# Patient Record
Sex: Female | Born: 1985 | Hispanic: Yes | Marital: Single
Health system: Southern US, Community
[De-identification: ages and names within clinical notes are randomized; demographics above are authoritative.]

---

## 2004-06-14 ENCOUNTER — Emergency Department: Payer: Self-pay | Admitting: Emergency Medicine

## 2011-03-02 ENCOUNTER — Ambulatory Visit: Payer: Self-pay | Admitting: Family Medicine

## 2011-07-27 ENCOUNTER — Inpatient Hospital Stay: Payer: Self-pay | Admitting: Obstetrics and Gynecology

## 2011-07-27 LAB — CBC WITH DIFFERENTIAL/PLATELET
Basophil #: 0.2 10*3/uL — ABNORMAL HIGH (ref 0.0–0.1)
Eosinophil #: 1 10*3/uL — ABNORMAL HIGH (ref 0.0–0.7)
HCT: 31.7 % — ABNORMAL LOW (ref 35.0–47.0)
HGB: 10.3 g/dL — ABNORMAL LOW (ref 12.0–16.0)
Lymphocyte %: 17.3 %
MCHC: 32.5 g/dL (ref 32.0–36.0)
MCV: 79 fL — ABNORMAL LOW (ref 80–100)
Monocyte %: 7.5 %
Neutrophil #: 6.2 10*3/uL (ref 1.4–6.5)
Platelet: 172 10*3/uL (ref 150–440)
RDW: 15.2 % — ABNORMAL HIGH (ref 11.5–14.5)

## 2011-07-28 LAB — HEMATOCRIT: HCT: 30.9 % — ABNORMAL LOW (ref 35.0–47.0)

## 2013-08-12 ENCOUNTER — Ambulatory Visit: Payer: Self-pay | Admitting: Primary Care

## 2014-01-03 ENCOUNTER — Inpatient Hospital Stay: Payer: Self-pay

## 2014-01-03 LAB — CBC WITH DIFFERENTIAL/PLATELET
Basophil #: 0.1 10*3/uL (ref 0.0–0.1)
Basophil %: 0.3 %
Eosinophil #: 0.2 10*3/uL (ref 0.0–0.7)
Eosinophil %: 1.1 %
HCT: 30.2 % — AB (ref 35.0–47.0)
HGB: 9.4 g/dL — ABNORMAL LOW (ref 12.0–16.0)
LYMPHS PCT: 5 %
Lymphocyte #: 1.1 10*3/uL (ref 1.0–3.6)
MCH: 24.8 pg — AB (ref 26.0–34.0)
MCHC: 30.9 g/dL — ABNORMAL LOW (ref 32.0–36.0)
MCV: 80 fL (ref 80–100)
Monocyte #: 1.1 x10 3/mm — ABNORMAL HIGH (ref 0.2–0.9)
Monocyte %: 5 %
Neutrophil #: 18.8 10*3/uL — ABNORMAL HIGH (ref 1.4–6.5)
Neutrophil %: 88.6 %
Platelet: 180 10*3/uL (ref 150–440)
RBC: 3.78 10*6/uL — ABNORMAL LOW (ref 3.80–5.20)
RDW: 15.6 % — AB (ref 11.5–14.5)
WBC: 21.3 10*3/uL — ABNORMAL HIGH (ref 3.6–11.0)

## 2014-01-04 LAB — HEMATOCRIT: HCT: 29.8 % — ABNORMAL LOW (ref 35.0–47.0)

## 2014-06-24 NOTE — H&P (Signed)
L&D Evaluation:  History:   HPI 29 y/o G3P2002 @ 39+wks EDC 07/29/11 arrives with c/o regular uc's, sm bloody show. Denies leaking fluid, baby is active.[facility] GBS +    Presents with contractions    Patient's Medical History No Chronic Illness    Patient's Surgical History none    Medications Pre Natal Vitamins    Allergies NKDA    Social History none    Family History Non-Contributory   ROS:   ROS All systems were reviewed.  HEENT, CNS, GI, GU, Respiratory, CV, Renal and Musculoskeletal systems were found to be normal.   Exam:   Vital Signs stable    Urine Protein not completed    General no apparent distress    Mental Status clear    Chest clear    Heart normal sinus rhythm    Abdomen gravid, non-tender    Estimated Fetal Weight Average for gestational age    Fetal Position vtx    Fundal Height term    Back no CVAT    Edema 2+  pedal    Reflexes 2+    Clonus negative    Pelvic no external lesions, 4cm 100% vtx @ -2 BOWI sm show    Mebranes Intact    FHT normal rate with no decels, baseline 130's 140's avg variability with accels    FHT Description 136    Ucx regular, q2/3 mins 45/60 sec moderate    Skin dry    Lymph no lymphadenopathy   Impression:   Impression early labor   Plan:   Plan monitor contractions and for cervical change, antibiotics for GBBS prophylaxis    Comments Admitted, knows what to expect 3rd baby. Breathing well thru uc's, declines pain meds, plans natural childbirth. IV ABX begun. Family supportive at bedside.   Electronic Signatures: Albertina ParrLugiano, [place] Katich B (CNM)  (Signed 12-Jun-13 01:26)  Authored: L&D Evaluation   Last Updated: 12-Jun-13 01:26 by Albertina ParrLugiano, Charman Blasco B (CNM)

## 2014-06-24 NOTE — H&P (Signed)
L&D Evaluation:  History:  HPI [id number] [facility] significant for UTI hx, GBS +, sinusitis, eosinophilia, attending [facility] for [facility] care [facility]. Pt had formerly been set up for induction of labor for 11/03/17/13 for post-dates. Pt is 7/100/vtx on last exam. O pos, Antibody neg, Pap neg, Rubella immune, Varicella immune, RPR NR, Neg HBSag, Chlamydia neg, declined AFP, 1 h GCT 138, GBS POS.   Presents with contractions   Patient's Medical History No Chronic Illness   Patient's Surgical History pt cannot answer   Medications Pre Natal Vitamins   Allergies NKDA   Social History none   Family History Non-Contributory   ROS:  ROS All systems were reviewed.  HEENT, CNS, GI, GU, Respiratory, CV, Renal and Musculoskeletal systems were found to be normal.   Exam:  Vital Signs stable   General no apparent distress, other   Mental Status clear   Chest clear   Heart normal sinus rhythm, no murmur/gallop/rubs   Abdomen gravid, non-tender   Estimated Fetal Weight Average for gestational age   Back no CVAT   Edema 1+   Reflexes 3+   Clonus negative   Pelvic 7/100/vtx   Mebranes Intact   Ucx q 2 mins, lass 40 secs   Skin dry   Impression:  Impression active labor   Plan:  Plan monitor contractions and for cervical change   Comments Cont to monitor UC/FHT, Antibiotcs starting to infuse   Electronic Signatures: Sharee PimpleJones, Caron W (CNM)  (Signed 20-Nov-15 05:27)  Authored: L&D Evaluation   Last Updated: 20-Nov-15 05:27 by Sharee PimpleJones, Caron W (CNM)

## 2015-11-20 IMAGING — US US OB US >=[ID] SNGL FETUS
1 series · 13 of 28 positions shown · non-contrast
Comparison: none

CLINICAL DATA: anatomy

EXAM:
ULTRASOUND OB >=L7BLE SINGLE FETUS

[Series 1: us ob us >=(id) sngl fetus · 0.24mm/px · 13 of 97 slices shown]
[im 4/97]
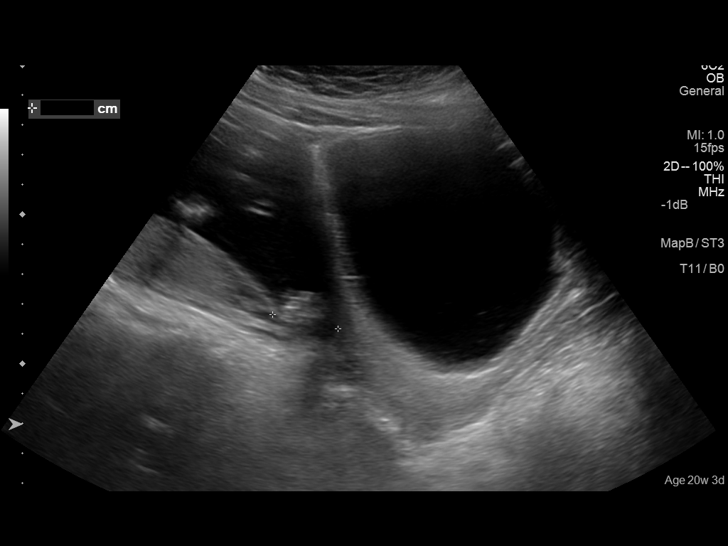
[im 11/97]
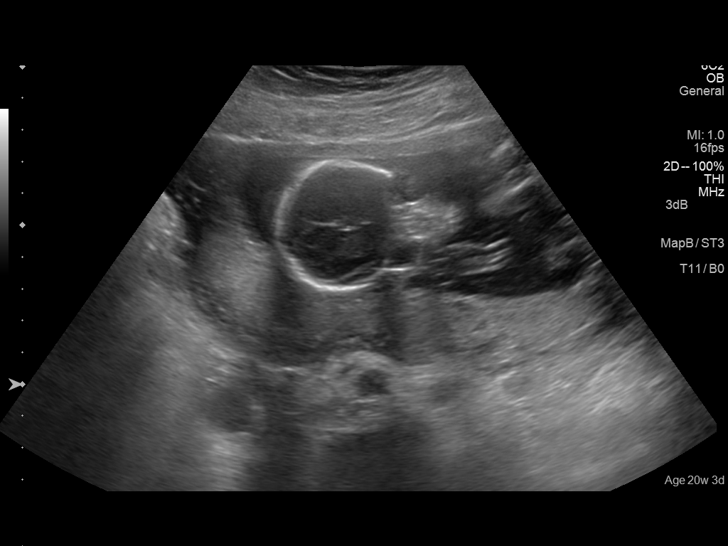
[im 18/97]
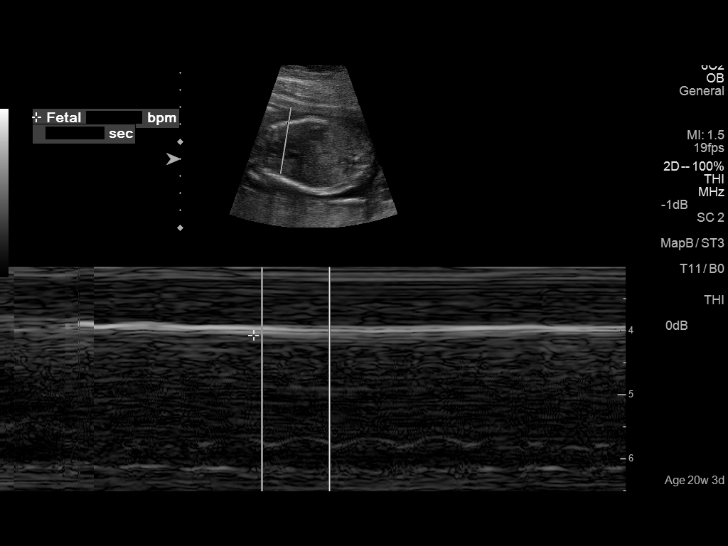
[im 25/97]
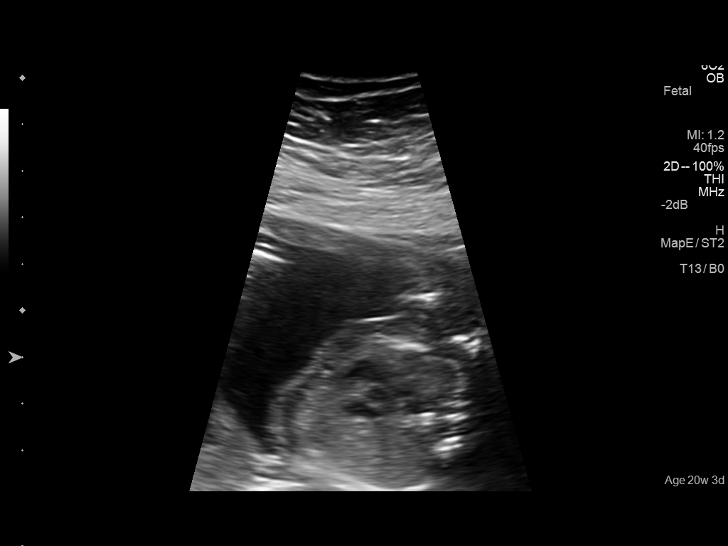
[im 33/97]
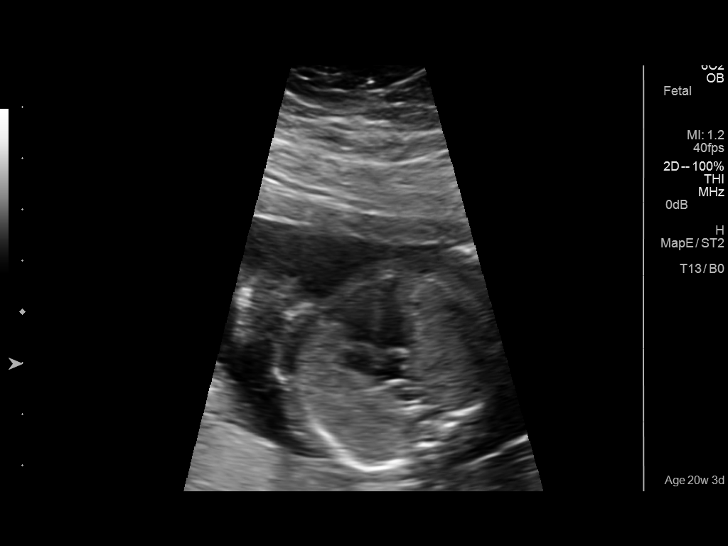
[im 40/97]
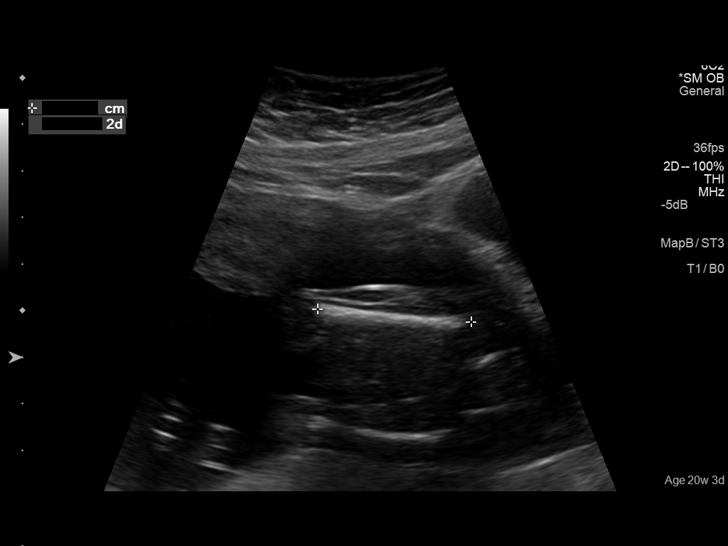
[im 50/97]
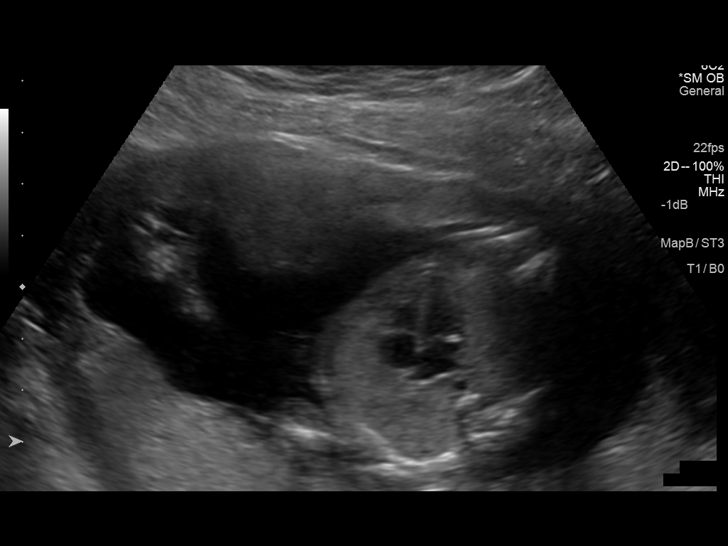
[im 57/97]
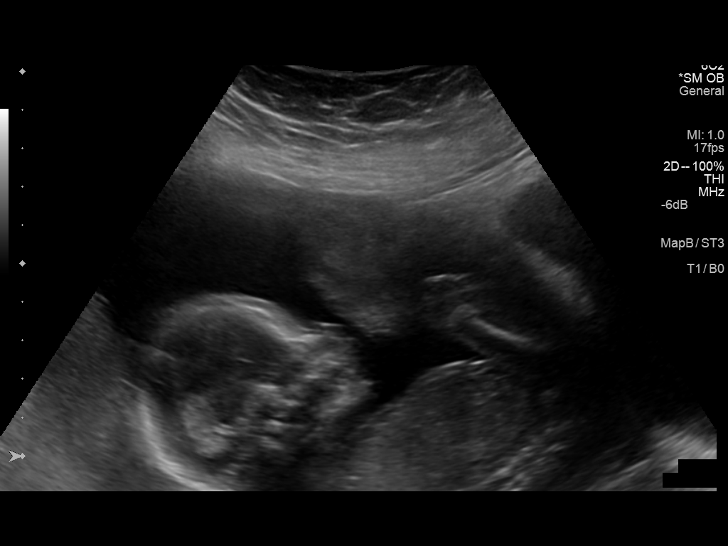
[im 65/97]
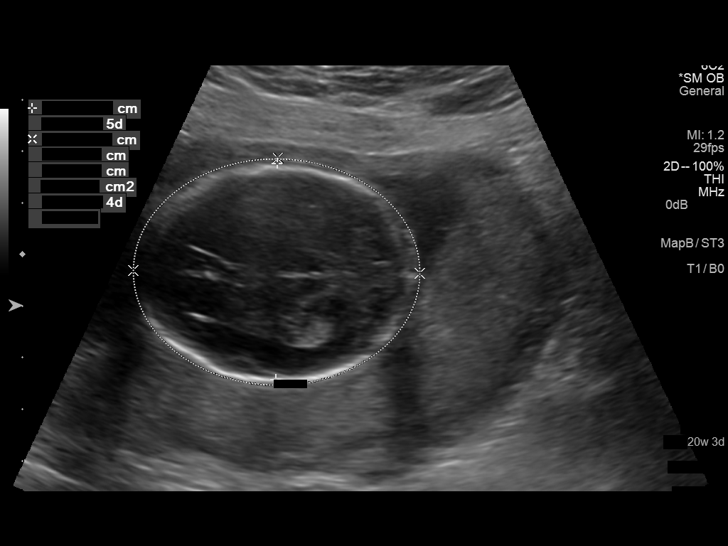
[im 72/97]
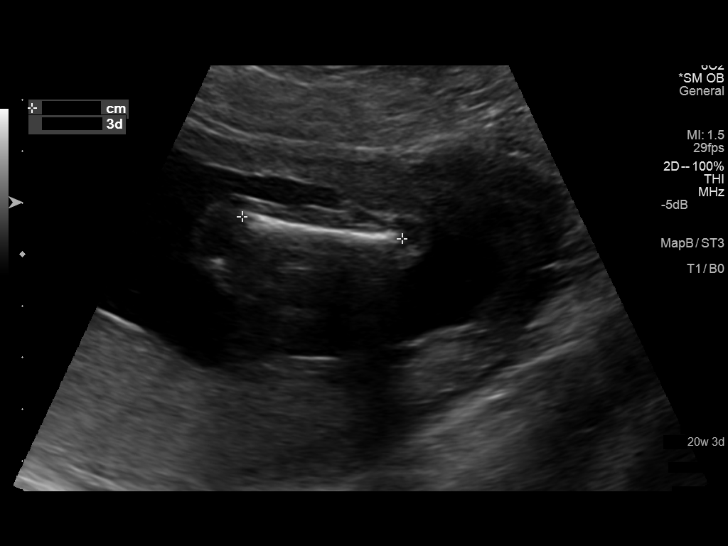
[im 79/97]
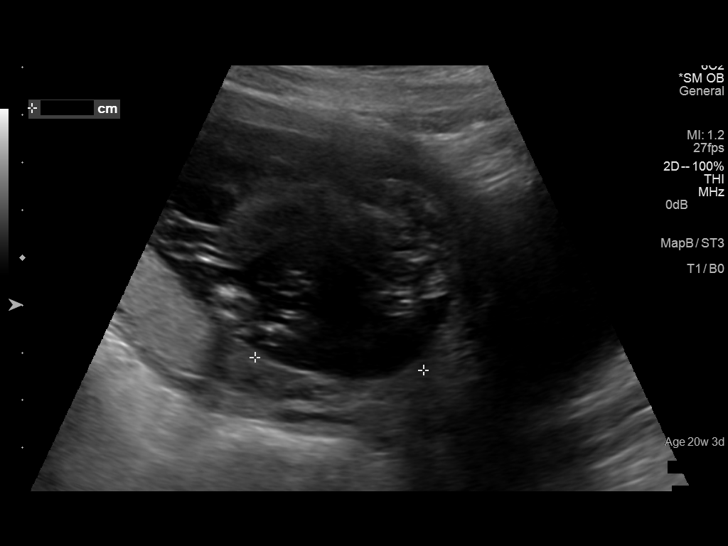
[im 86/97]
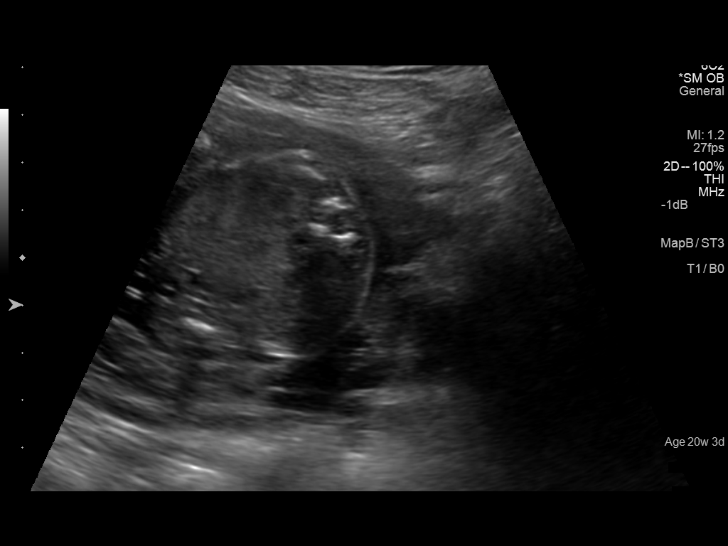
[im 93/97]
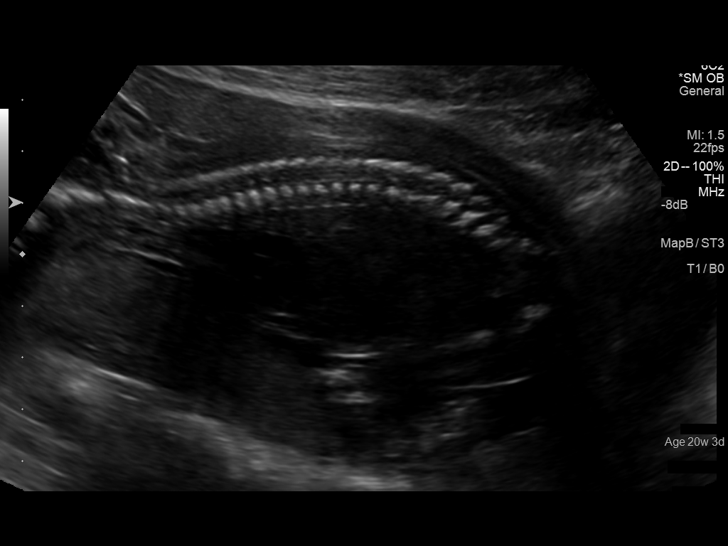

[13 of 28 positions shown; findings below may reference images not displayed]

FINDINGS: Number of Fetuses: 1

Heart Rate:  152 bpm

Movement: Yes

Presentation: Breech

Previa: No

Placental Location: Fundal

Amniotic Fluid (Subjective): Normal

FETAL BIOMETRY

BPD:  4.2cm 18w 6d

HC:    16cm  18w   6d

AC:   14.5cm  19.W   6d

FL:   3.3cm  20w   1d

Current Mean GA: 19.W 4d

US EDC: 01/02/2014.

Assigned GA:  20w 3d

Assigned EDC:  12/27/2013.

Estimated Fetal Weight:  386grams 0lb 11oz  17%

FETAL ANATOMY

Lateral Ventricles: Appears normal

Thalami/CSP: Appears normal

Posterior Fossa:  Appears normal

Spine: Appears normal

Upper Lip: Appears normal

4 Chamber Heart on Left: Appears normal

Stomach on Left: Appears normal

3 Vessel Cord: Appears normal

Cord Insertion site: Appears normal

Kidneys: Appears normal

Bladder: Appears normal

Extremities: Appears normal

Additional Anatomy Visualized: Left upper extremity, left lower
extremity, right upper extremity, right lower extremity

Technically difficult due to: None

Maternal Findings:

Cervix:  3.9  cm  cervical os closed
IMPRESSION: Single viable intrauterine pregnancy with an estimated gestational
age of 19 weeks 4 days, and an EDC of 01/02/2014.

## 2023-08-07 ENCOUNTER — Ambulatory Visit: Payer: Self-pay
